# Patient Record
Sex: Male | Born: 1960 | Race: White | Hispanic: No | Marital: Married | Smoking: Never smoker
Health system: Southern US, Community
[De-identification: ages and names within clinical notes are randomized; demographics above are authoritative.]

## PROBLEM LIST (undated history)

## (undated) DIAGNOSIS — R7989 Other specified abnormal findings of blood chemistry: Secondary | ICD-10-CM

## (undated) DIAGNOSIS — F329 Major depressive disorder, single episode, unspecified: Secondary | ICD-10-CM

## (undated) DIAGNOSIS — F32A Depression, unspecified: Secondary | ICD-10-CM

## (undated) HISTORY — DX: Major depressive disorder, single episode, unspecified: F32.9

## (undated) HISTORY — DX: Depression, unspecified: F32.A

## (undated) HISTORY — DX: Other specified abnormal findings of blood chemistry: R79.89

---

## 2006-09-12 ENCOUNTER — Encounter: Admission: RE | Admit: 2006-09-12 | Discharge: 2006-09-12 | Payer: Self-pay | Admitting: Allergy and Immunology

## 2006-09-20 ENCOUNTER — Encounter: Admission: RE | Admit: 2006-09-20 | Discharge: 2006-09-20 | Payer: Self-pay | Admitting: Allergy and Immunology

## 2008-07-13 IMAGING — CT CT ABDOMEN W/ CM
2 of 5 series · 17 of 46 positions shown, 19 images · IV contrast (READICAT/WATER & [ID] OMNI 300)
Comparison: Ultrasound exam from 09/12/2006

ABDOMEN CT WITH CONTRAST:

CLINICAL DATA: Hydronephrosis
TECHNIQUE: Multidetector CT imaging of the abdomen and pelvis was performed
following the standard protocol during bolus administration of intravenous
contrast.

Contrast:  125 cc Omnipaque 300

[Series 3: routine abdomen · axial · 0.78mm/px · z∈[-431,-31]mm · 14 of 89 slices shown, 16 images]
[im 5/89  soft-tissue]
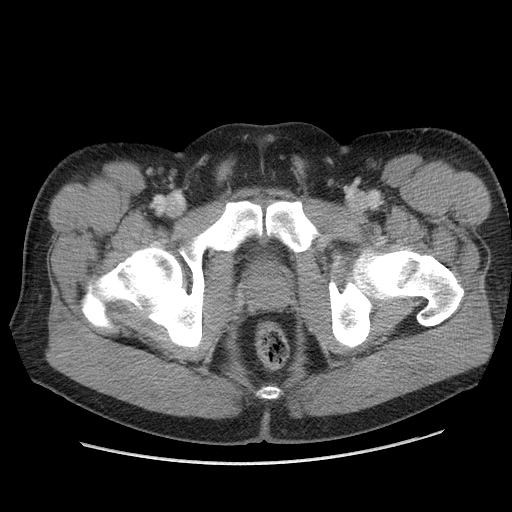
[im 5/89  bone]
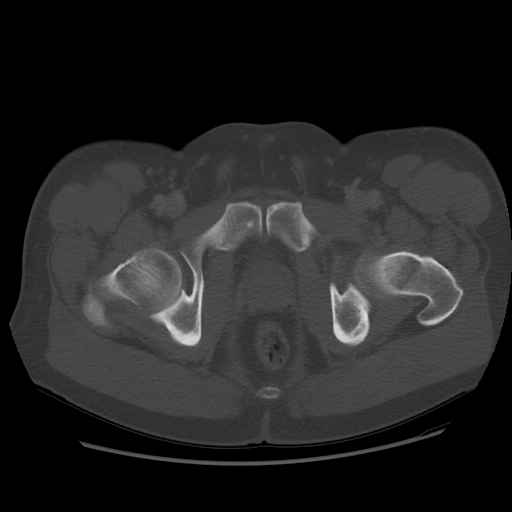
[im 14/89  soft-tissue]
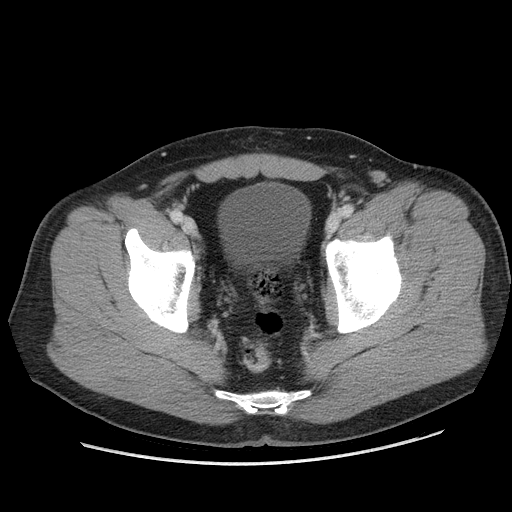
[im 18/89  soft-tissue]
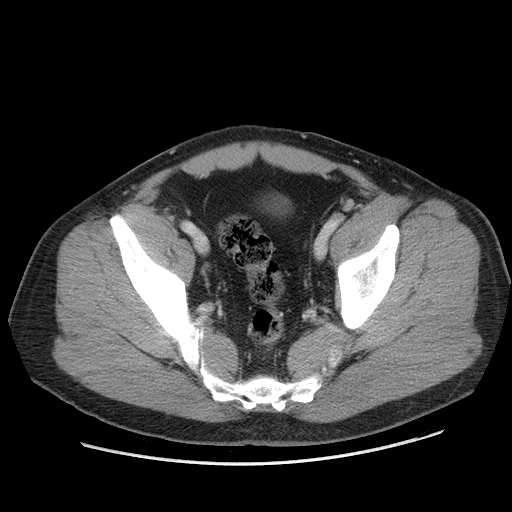
[im 23/89  soft-tissue]
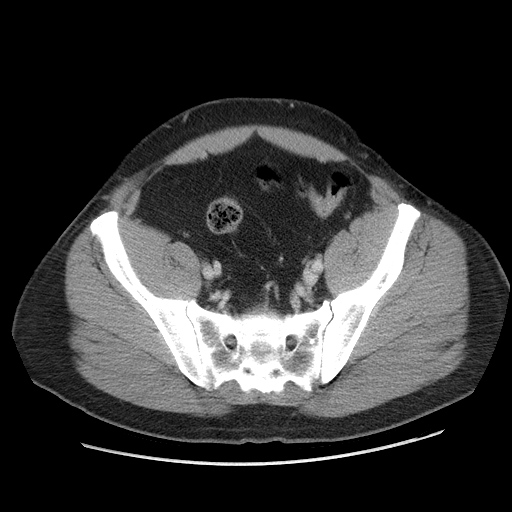
[im 31/89  soft-tissue]
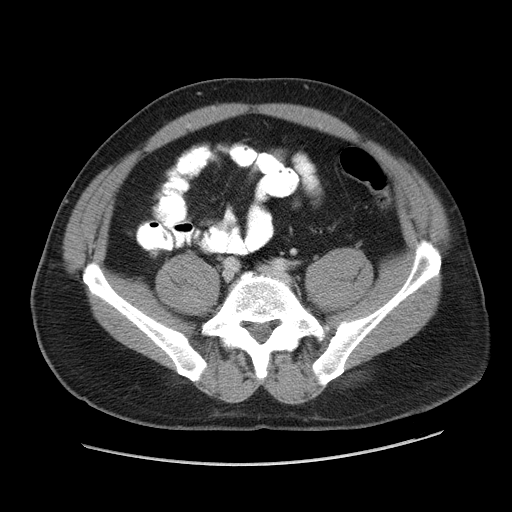
[im 36/89  soft-tissue]
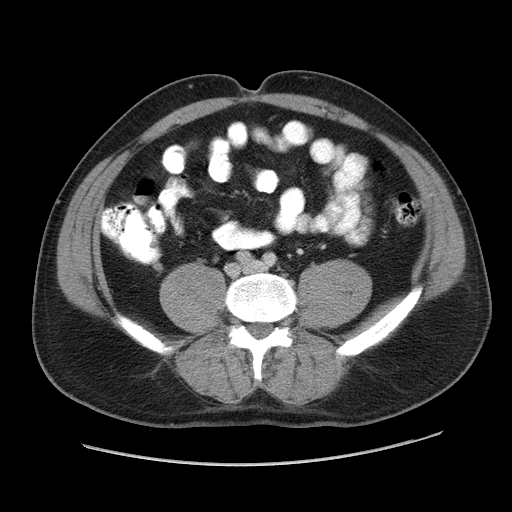
[im 40/89  soft-tissue]
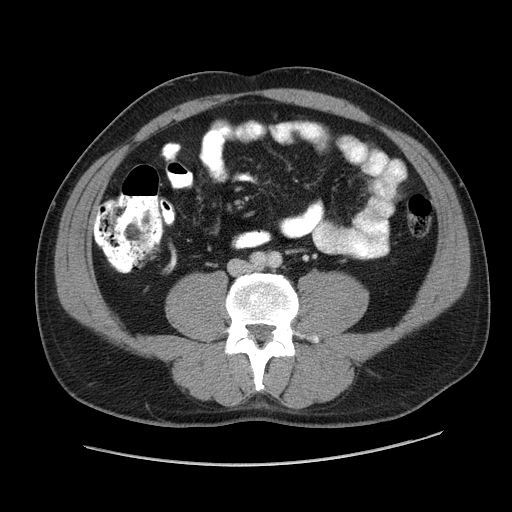
[im 49/89  soft-tissue]
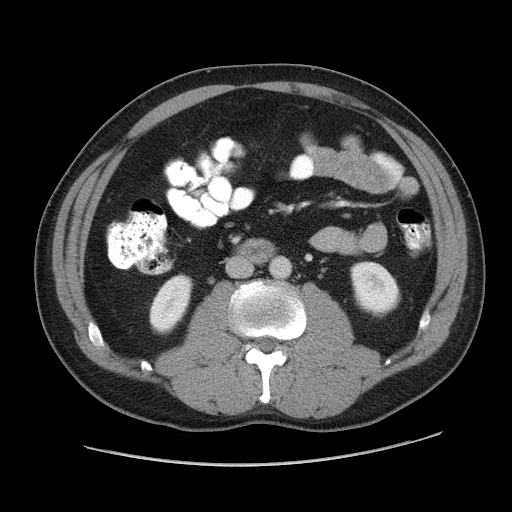
[im 53/89  soft-tissue]
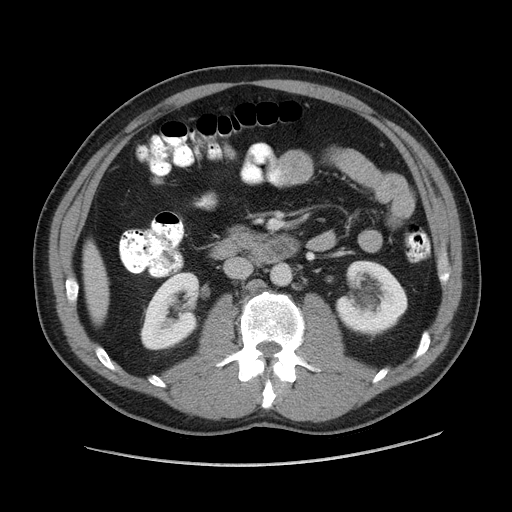
[im 53/89  bone]
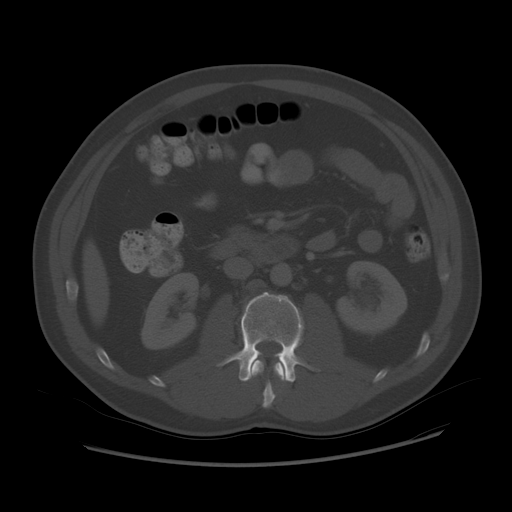
[im 58/89  soft-tissue]
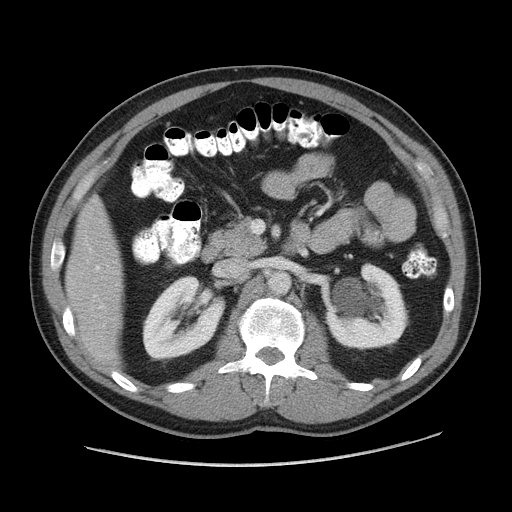
[im 67/89  soft-tissue]
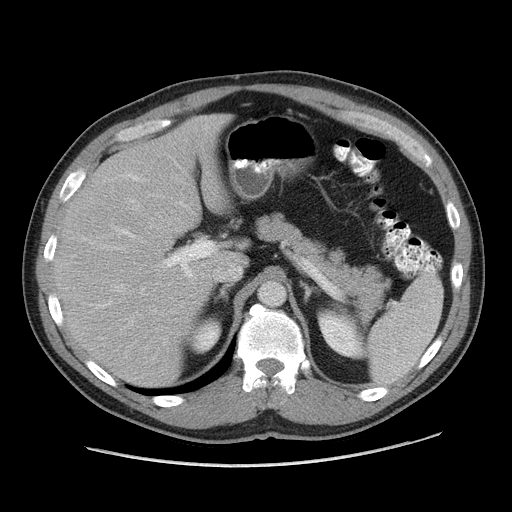
[im 71/89  soft-tissue]
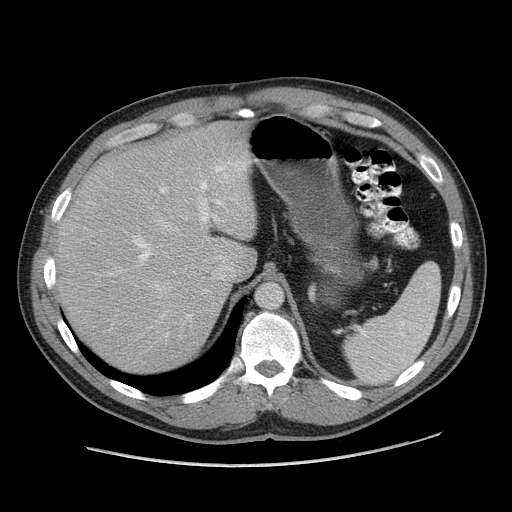
[im 75/89  soft-tissue]
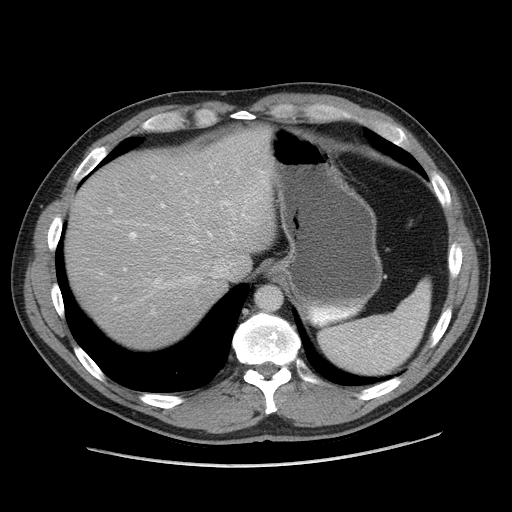
[im 84/89  soft-tissue]
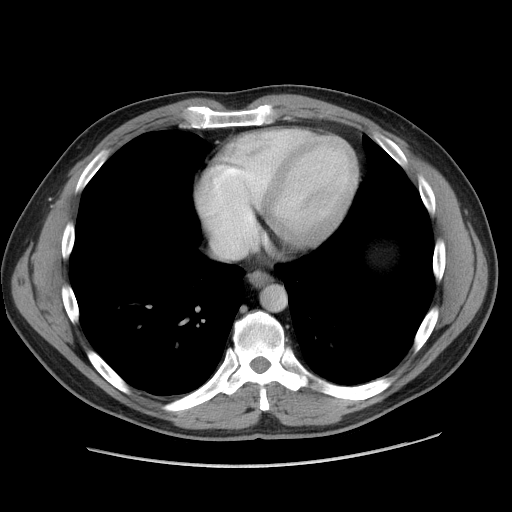

[Series 602: sagittal body · sagittal · 0.96mm/px · 3 of 161 slices shown]
[im 54/161  soft-tissue]
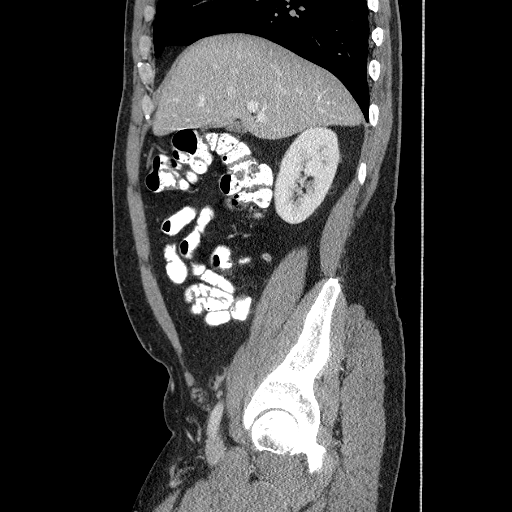
[im 72/161  soft-tissue]
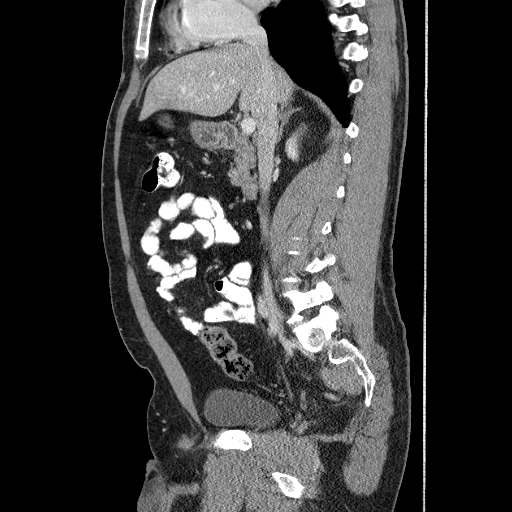
[im 89/161  soft-tissue]
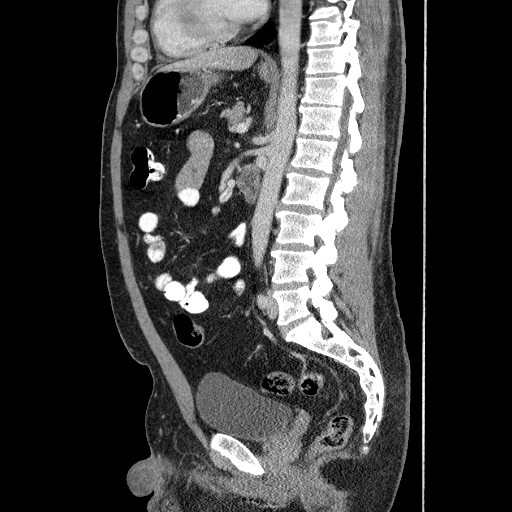

[17 of 46 positions shown; findings below may reference images not displayed]

FINDINGS: No focal abnormality is seen in the liver or spleen. Mild fatty
infiltration of the liver is noted. The stomach, duodenum, pancreas,
gallbladder, adrenal glands, and right kidney have normal imaging features. The
tiny cholesterol polyp seen in the gallbladder on the previous ultrasound is not
apparent by CT. A [DATE] x 3.3 cm central sinus cyst is identified in the left
kidney. This would account for the ultrasound appearance of left hydronephrosis.
IMPRESSION: Central sinus cyst in the left kidney accounts for the appearance of left
hydronephrosis on the recent ultrasound. Today's CT scan demonstrates that there
is no hydronephrosis in either kidney.

PELVIS CT WITH CONTRAST:
FINDINGS: No aortic aneurysm. No intraperitoneal free fluid. No pelvic
lymphadenopathy. Bladder is unremarkable. Colon has normal imaging features. The
terminal ileum and the appendix are normal.
IMPRESSION: Unremarkable CT examination of the pelvis.

## 2011-11-03 ENCOUNTER — Encounter: Payer: Self-pay | Admitting: Family Medicine

## 2011-11-03 ENCOUNTER — Ambulatory Visit (INDEPENDENT_AMBULATORY_CARE_PROVIDER_SITE_OTHER): Payer: BC Managed Care – PPO | Admitting: Family Medicine

## 2011-11-03 VITALS — BP 140/90 | HR 72 | Temp 97.8°F | Resp 12 | Ht 69.75 in | Wt 201.0 lb

## 2011-11-03 DIAGNOSIS — E291 Testicular hypofunction: Secondary | ICD-10-CM

## 2011-11-03 DIAGNOSIS — Z0000 Encounter for general adult medical examination without abnormal findings: Secondary | ICD-10-CM

## 2011-11-03 DIAGNOSIS — R209 Unspecified disturbances of skin sensation: Secondary | ICD-10-CM

## 2011-11-03 DIAGNOSIS — R6882 Decreased libido: Secondary | ICD-10-CM

## 2011-11-03 DIAGNOSIS — R7989 Other specified abnormal findings of blood chemistry: Secondary | ICD-10-CM | POA: Insufficient documentation

## 2011-11-03 DIAGNOSIS — R202 Paresthesia of skin: Secondary | ICD-10-CM

## 2011-11-03 LAB — CBC WITH DIFFERENTIAL/PLATELET
Basophils Absolute: 0.1 10*3/uL (ref 0.0–0.1)
Eosinophils Relative: 2.4 % (ref 0.0–5.0)
HCT: 45 % (ref 39.0–52.0)
Lymphs Abs: 1.7 10*3/uL (ref 0.7–4.0)
Monocytes Relative: 12.6 % — ABNORMAL HIGH (ref 3.0–12.0)
Neutrophils Relative %: 59.5 % (ref 43.0–77.0)
Platelets: 249 10*3/uL (ref 150.0–400.0)
RDW: 13.1 % (ref 11.5–14.6)
WBC: 7 10*3/uL (ref 4.5–10.5)

## 2011-11-03 LAB — LIPID PANEL
Cholesterol: 200 mg/dL (ref 0–200)
HDL: 39.8 mg/dL (ref >39.00)
Total CHOL/HDL Ratio: 5
Triglycerides: 252 mg/dL — ABNORMAL HIGH (ref 0.0–149.0)
VLDL: 50.4 mg/dL — ABNORMAL HIGH (ref 0.0–40.0)

## 2011-11-03 LAB — POCT URINALYSIS DIPSTICK
Bilirubin, UA: NEGATIVE
Glucose, UA: NEGATIVE
Ketones, UA: NEGATIVE
Leukocytes, UA: NEGATIVE
Nitrite, UA: NEGATIVE

## 2011-11-03 LAB — HEPATIC FUNCTION PANEL
AST: 18 U/L (ref 0–37)
Albumin: 4.3 g/dL (ref 3.5–5.2)
Alkaline Phosphatase: 79 U/L (ref 39–117)
Total Bilirubin: 0.6 mg/dL (ref 0.3–1.2)

## 2011-11-03 LAB — BASIC METABOLIC PANEL
CO2: 30 mEq/L (ref 19–32)
Chloride: 101 mEq/L (ref 96–112)
Creatinine, Ser: 1.1 mg/dL (ref 0.4–1.5)
Potassium: 4.8 mEq/L (ref 3.5–5.1)
Sodium: 138 mEq/L (ref 135–145)

## 2011-11-03 LAB — TSH: TSH: 1.74 u[IU]/mL (ref 0.35–5.50)

## 2011-11-03 LAB — TESTOSTERONE: Testosterone: 276.3 ng/dL — ABNORMAL LOW (ref 350.00–890.00)

## 2011-11-03 LAB — LDL CHOLESTEROL, DIRECT: Direct LDL: 116.2 mg/dL

## 2011-11-03 MED ORDER — TETANUS-DIPHTH-ACELL PERTUSSIS 5-2.5-18.5 LF-MCG/0.5 IM SUSP: 0.5000 mL | Freq: Once | INTRAMUSCULAR | Status: DC

## 2011-11-03 NOTE — Patient Instructions (Addendum)
Try to eliminate oral tobacco use

## 2011-11-03 NOTE — Progress Notes (Signed)
Subjective:    Patient ID: Allen Webster, male    DOB: 1960/04/07, 51 y.o.   MRN: [id number]  HPI  Here to establish and for complete physical. History of reported low testosterone. Was briefly replaced but does not recall any symptomatic improvement. He requests repeat levels today. He's had no prior surgeries. Takes no regular medications. Uses oral tobacco but no history of smoking. No alcohol use.  Had some recent problems with progressive paresthesias mostly feet to lesser extent hands. Symptoms are bilateral. Burning dysesthesias. Questionable history of low B12. Had seen alternative medicine physician who at one point replaced . He is non vegan. Does consume dairy products. No history of gastric surgery. No history of bowel problems. Also having increased fatigue. No consistent exercise. Increased job stress as Barista.  Last tetanus over 10 years ago. Family history significant father coronary disease at 33. Mother history of stroke and supranuclear palsy.  Past Medical History  Diagnosis Date  . Depression   . Low testosterone    History reviewed. No pertinent past surgical history.  reports that he has never smoked. His smokeless tobacco use includes Chew. His alcohol and drug histories not on file. family history includes Arthritis in his maternal grandfather, maternal grandmother, paternal grandfather, and paternal grandmother; Heart disease (age of onset:61) in his father; Hypertension in his father; and Stroke in his mother. No Known Allergies   Review of Systems  Constitutional: Positive for fatigue. Negative for fever, activity change and appetite change.  HENT: Negative for ear pain, congestion and trouble swallowing.   Eyes: Negative for pain and visual disturbance.  Respiratory: Negative for cough, shortness of breath and wheezing.   Cardiovascular: Negative for chest pain and palpitations.  Gastrointestinal: Negative for nausea, vomiting, abdominal pain,  diarrhea, constipation, blood in stool, abdominal distention and rectal pain.  Genitourinary: Negative for dysuria, hematuria and testicular pain.  Musculoskeletal: Negative for joint swelling and arthralgias.  Skin: Negative for rash.  Neurological: Negative for dizziness, syncope and headaches.  Hematological: Negative for adenopathy.  Psychiatric/Behavioral: Negative for confusion and dysphoric mood.       Objective:   Physical Exam  Constitutional: He is oriented to person, place, and time. He appears well-developed and well-nourished. No distress.  HENT:  Head: Normocephalic and atraumatic.  Right Ear: External ear normal.  Left Ear: External ear normal.  Mouth/Throat: Oropharynx is clear and moist.  Eyes: Conjunctivae and EOM are normal. Pupils are equal, round, and reactive to light.  Neck: Normal range of motion. Neck supple. No thyromegaly present.  Cardiovascular: Normal rate, regular rhythm and normal heart sounds.   No murmur heard. Pulmonary/Chest: No respiratory distress. He has no wheezes. He has no rales.  Abdominal: Soft. Bowel sounds are normal. He exhibits no distension and no mass. There is no tenderness. There is no rebound and no guarding.  Genitourinary: Rectum normal and prostate normal.  Musculoskeletal: He exhibits no edema.  Lymphadenopathy:    He has no cervical adenopathy.  Neurological: He is alert and oriented to person, place, and time. He displays normal reflexes. No cranial nerve deficit.  Skin: No rash noted.  Psychiatric: He has a normal mood and affect.          Assessment & Plan:  #1 complete physical. Obtain screening labs. Stop oral tobacco use. Establish more consistent exercise. Tetanus booster given. Consider colonoscopy and PSA screening by next year #2 low libido and fatigue. Reported history of low testosterone. Repeat testosterone level. Patient is  interested in replacement if low  #3 paresthesias involving mostly feet. Check labs  including glucose, B12 level, and thyroid function.

## 2011-11-09 ENCOUNTER — Telehealth: Payer: Self-pay | Admitting: Family Medicine

## 2011-11-09 NOTE — Telephone Encounter (Signed)
Patient's wife is calling stating that he never received a call back with lab results. Please assist.

## 2011-11-10 ENCOUNTER — Other Ambulatory Visit: Payer: Self-pay | Admitting: *Deleted

## 2011-11-10 DIAGNOSIS — E349 Endocrine disorder, unspecified: Secondary | ICD-10-CM

## 2011-11-10 MED ORDER — TESTOSTERONE 20.25 MG/ACT (1.62%) TD GEL: 2.0000 | Freq: Every morning | TRANSDERMAL | Status: DC

## 2011-11-10 NOTE — Telephone Encounter (Signed)
i don't recall seeing these in my in basket.  We need to try to see why they did not end up there.  Labs significant for elevated triglycerides and low testosterone. If patient interested in replacement, would consider Androgel 1.62% one pump spray each arm once daily and repeat total testosterone level in one month.

## 2011-11-10 NOTE — Telephone Encounter (Signed)
Wife informed, labs and Cholesterol diet tips mailed to pt home with instructions included.  Androgel and repeat Testosterone future ordered

## 2012-01-02 ENCOUNTER — Encounter: Payer: Self-pay | Admitting: Family Medicine

## 2012-01-02 ENCOUNTER — Ambulatory Visit (INDEPENDENT_AMBULATORY_CARE_PROVIDER_SITE_OTHER): Payer: BC Managed Care – PPO | Admitting: Family Medicine

## 2012-01-02 VITALS — BP 115/78 | Temp 98.0°F | Wt 200.0 lb

## 2012-01-02 DIAGNOSIS — R7989 Other specified abnormal findings of blood chemistry: Secondary | ICD-10-CM

## 2012-01-02 DIAGNOSIS — E291 Testicular hypofunction: Secondary | ICD-10-CM

## 2012-01-02 LAB — TESTOSTERONE: Testosterone: 278.58 ng/dL — ABNORMAL LOW (ref 350.00–890.00)

## 2012-01-02 NOTE — Progress Notes (Signed)
  Subjective:    Patient ID: Allen Webster, male    DOB: April 13, 1960, 51 y.o.   MRN: [id number]  HPI  Recent issues with fatigue and low libido. He thinks some of this is job stress related. Testosterone was low at 276. We initiated replacement with AndroGel pump. Currently one spray pump per arm once daily. Has not seen much change in symptoms yet. No followup labs yet. He has some fatigue and decreased stamina but no mood disorder.   Review of Systems  Constitutional: Positive for fatigue.  Eyes: Negative for visual disturbance.  Respiratory: Negative for cough, chest tightness and shortness of breath.   Cardiovascular: Negative for chest pain, palpitations and leg swelling.  Neurological: Negative for dizziness, syncope, weakness, light-headedness and headaches.       Objective:   Physical Exam  Constitutional: He appears well-developed and well-nourished.  Cardiovascular: Normal rate and regular rhythm.   No murmur heard. Pulmonary/Chest: Effort normal and breath sounds normal. No respiratory distress. He has no wheezes. He has no rales.  Musculoskeletal: He exhibits no edema.          Assessment & Plan:  Low testosterone. Recheck total testosterone level. If he is adequately replaced and still feeling no benefit symptomatically he will consider discontinuation. Flu vaccine offered and declined.

## 2012-01-03 ENCOUNTER — Other Ambulatory Visit: Payer: Self-pay | Admitting: *Deleted

## 2012-01-03 DIAGNOSIS — Z8639 Personal history of other endocrine, nutritional and metabolic disease: Secondary | ICD-10-CM

## 2012-01-03 MED ORDER — TESTOSTERONE 20.25 MG/ACT (1.62%) TD GEL: 3.0000 | Freq: Every morning | TRANSDERMAL | Status: AC

## 2012-01-03 NOTE — Progress Notes (Signed)
Quick Note:  Pt wife called back and info was given to her as requested by her husband. ______

## 2012-01-03 NOTE — Progress Notes (Signed)
Quick Note:  LMTCB ______ 

## 2012-07-16 ENCOUNTER — Ambulatory Visit (INDEPENDENT_AMBULATORY_CARE_PROVIDER_SITE_OTHER): Payer: No Typology Code available for payment source | Admitting: Family Medicine

## 2012-07-16 ENCOUNTER — Encounter: Payer: Self-pay | Admitting: Family Medicine

## 2012-07-16 VITALS — BP 140/90 | Temp 98.7°F

## 2012-07-16 DIAGNOSIS — L03119 Cellulitis of unspecified part of limb: Secondary | ICD-10-CM

## 2012-07-16 DIAGNOSIS — T23209A Burn of second degree of unspecified hand, unspecified site, initial encounter: Secondary | ICD-10-CM

## 2012-07-16 DIAGNOSIS — T23201A Burn of second degree of right hand, unspecified site, initial encounter: Secondary | ICD-10-CM

## 2012-07-16 MED ORDER — CEPHALEXIN 500 MG PO CAPS: 500.0000 mg | ORAL_CAPSULE | Freq: Four times a day (QID) | ORAL | Status: AC

## 2012-07-16 NOTE — Progress Notes (Signed)
  Subjective:    Patient ID: Allen Webster, male    DOB: October 06, 1960, 52 y.o.   MRN: [id number]  HPI Acute visit Burn right hand last Thursday while making candy apples Doing relatively well until yesterday when he noticed some redness, increased swelling, and increased pain. Denies any fever or chills. Patient had tetanus booster August 2013. Right hand edema has increased throughout the day, especially with dependency    Review of Systems  Constitutional: Negative for fever and chills.       Objective:   Physical Exam  Constitutional: He appears well-developed and well-nourished.  Cardiovascular: Normal rate and regular rhythm.   Skin:  Right hand reveals a 1.5 x 3 cm vesicle from second degree burn. Unroofed vesicle and clear serous fluid drained. Surrounding area 6 about 8 cm of erythema, warmth and slight tenderness          Assessment & Plan:  Second degree burn right hand with secondary cellulitis changes now. Tetanus Up to date. Keflex 500 mg 4 times a day for 10 days. Elevate hand frequently. Recommended followup in 2 days reassess but he declines because of busy schedule. He knows to followup immediately for any fever or worsening redness

## 2012-07-16 NOTE — Patient Instructions (Addendum)
Cellulitis Cellulitis is an infection of the skin and the tissue beneath it. The infected area is usually red and tender. Cellulitis occurs most often in the arms and lower legs.   CAUSES   Cellulitis is caused by bacteria that enter the skin through cracks or cuts in the skin. The most common types of bacteria that cause cellulitis are Staphylococcus and Streptococcus. SYMPTOMS    Redness and warmth.   Swelling.   Tenderness or pain.   Fever.  DIAGNOSIS  Your caregiver can usually determine what is wrong based on a physical exam. Blood tests may also be done. TREATMENT   Treatment usually involves taking an antibiotic medicine. HOME CARE INSTRUCTIONS    Take your antibiotics as directed. Finish them even if you start to feel better.   Keep the infected arm or leg elevated to reduce swelling.   Apply a warm cloth to the affected area up to 4 times per day to relieve pain.   Only take over-the-counter or prescription medicines for pain, discomfort, or fever as directed by your caregiver.   Keep all follow-up appointments as directed by your caregiver.  SEEK MEDICAL CARE IF:    You notice red streaks coming from the infected area.   Your red area gets larger or turns dark in color.   Your bone or joint underneath the infected area becomes painful after the skin has healed.   Your infection returns in the same area or another area.   You notice a swollen bump in the infected area.   You develop new symptoms.  SEEK IMMEDIATE MEDICAL CARE IF:    You have a fever.   You feel very sleepy.   You develop vomiting or diarrhea.   You have a general ill feeling (malaise) with muscle aches and pains.  MAKE SURE YOU:    Understand these instructions.   Will watch your condition.   Will get help right away if you are not doing well or get worse.  Document Released: 12/01/2004 Document Revised: 08/23/2011 Document Reviewed: 05/09/2011 [employer] Patient Information 2013  [employer]    

## 2014-05-19 ENCOUNTER — Ambulatory Visit: Payer: No Typology Code available for payment source | Attending: Internal Medicine

## 2018-08-31 ENCOUNTER — Encounter: Payer: Self-pay | Admitting: Family Medicine

## 2018-08-31 ENCOUNTER — Ambulatory Visit: Payer: Self-pay | Attending: Family Medicine | Admitting: Family Medicine

## 2018-08-31 ENCOUNTER — Other Ambulatory Visit: Payer: Self-pay

## 2018-08-31 VITALS — BP 152/89 | HR 74 | Ht 69.75 in | Wt 196.6 lb

## 2018-08-31 DIAGNOSIS — R7303 Prediabetes: Secondary | ICD-10-CM

## 2018-08-31 DIAGNOSIS — G609 Hereditary and idiopathic neuropathy, unspecified: Secondary | ICD-10-CM

## 2018-08-31 LAB — POCT GLYCOSYLATED HEMOGLOBIN (HGB A1C): Hemoglobin A1C: 5.8 % — AB (ref 4.0–5.6)

## 2018-08-31 MED ORDER — VITAMIN B-12 1000 MCG PO TABS: 1000.0000 ug | ORAL_TABLET | Freq: Every day | ORAL | 5 refills | Status: AC

## 2018-08-31 NOTE — Progress Notes (Signed)
New patient, Talk about his Neuropathy

## 2018-08-31 NOTE — Progress Notes (Signed)
New Patient Office Visit  Subjective:  Patient ID: Allen Webster, male    DOB: [birth year]  Age: 58 y.o. MRN: [id number]  CC: establish care; neuropathy  HPI Allen Webster presents to establish care as a new patient.  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  Patient also reports a chronic history of numbness in his lower legs and feet.  He has an abnormal sensation when walking as if something is under his feet.  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  He reports that when he received B12 injections in the past he actually did feel better and had less numbness.  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  He has noticed that his numbness/abnormal sensation in his legs and feet are worse by the end of the day.  He does not have any history of elevated blood sugar/diabetes.  He believes that he had a heavy metal panel done in the past which was negative.  He does not have any history of a pinched nerve/degenerative disc disease in his back that he is aware of but patient states that he is done a lot of labor-intensive work in the past and this could be a possibility although he does not currently have a lot of issues with back pain.  He feels that overall he is very healthy.  He does currently use chewing tobacco/tobacco pouch and is trying to find a natural mint pouch that he used in the past to help stop tobacco use.   Past Medical History:  Diagnosis Date  . Depression   . Low testosterone     Past Surgical History:  Procedure Laterality Date  . NO PAST SURGERIES      Family History  Problem  Relation Age of Onset  . Stroke Mother   . Hypertension Father   . Heart disease Father [id number]  . Arthritis Maternal Grandmother   . Arthritis Maternal Grandfather   . Arthritis Paternal Grandmother   . Arthritis Paternal Grandfather     Social History   Tobacco Use  . Smoking status: Never Smoker  . Smokeless tobacco: Current User    Types: Chew  Substance Use Topics  . Alcohol use: Not Currently    Frequency: Never  . Drug use: Never    ROS Review of Systems  Constitutional: Negative for chills, fatigue and unexpected weight change.  HENT: Negative for sore throat and trouble swallowing.   Respiratory: Negative for cough and shortness of breath.   Cardiovascular: Negative for chest pain and leg swelling.  Gastrointestinal: Negative for abdominal pain, constipation, diarrhea and nausea.  Endocrine: Positive for polydipsia (thinks this is related to working outside and he drinks a lot of water). Negative for polyphagia and polyuria.  Genitourinary: Negative for dysuria and frequency.  Musculoskeletal: Negative for arthralgias, back pain, gait problem, joint swelling and myalgias.  Neurological: Positive for numbness. Negative for dizziness and headaches.  Hematological: Negative for adenopathy. Does not bruise/bleed easily.    Objective:   Today's Vitals: BP (!) 152/89 (BP Location: Right  Arm, Patient Position: Sitting, Cuff Size: Large)   Pulse 74   Ht 5' 9.75" (1.772 m)   Wt 196 lb 9.6 oz (89.2 kg)   SpO2 97%   BMI 28.41 kg/m   Physical Exam  Assessment & Plan:  1. Idiopathic peripheral neuropathy Patient reports that he has had some longstanding issues with peripheral neuropathy/numbness and abnormal sensation in the feet and lower legs.  He states that he was told in the past that he had a vitamin B12 deficiency and he did have some improvement while taking vitamin B12.  Discussed with patient that I would also like to take hemoglobin A1c to look for possible  diabetes.  If his symptoms are not improving, he may also benefit from nerve testing/EMG.  Prescription provided for vitamin B12 to see if this gives any improvement in his current symptoms.  Patient has been encouraged to apply for the assistance program through this office to see if he may qualify for assistance with cost of medical visits/blood work and specialty visits. - HgB A1c - vitamin B-12 (CYANOCOBALAMIN) 1000 MCG tablet; Take 1 tablet (1,000 mcg total) by mouth daily.  Dispense: 30 tablet; Refill: 5  2. Prediabetes Patient with hemoglobin A1c of 5.8 which is slightly above normal and indicates an increased risk of diabetes.  Patient is encouraged to follow a low carbohydrate diet, continue exercise.  I am not sure if this level has been higher in the past which led to some of patient's neuropathic symptoms.  Test will be repeated in 3 to 4 months but he should return sooner if having any increased thirst, urinary frequency, blurred vision, increased appetite or wounds that are slow to heal which could be indicative of increased blood sugar levels/diabetes.  Outpatient Encounter Medications as of 08/31/2018  Medication Sig  . cephALEXin (KEFLEX) 500 MG capsule Take 1 capsule (500 mg total) by mouth 4 (four) times daily. (Patient not taking: Reported on 08/31/2018)  . Testosterone (ANDROGEL PUMP) 20.25 MG/ACT (1.62%) GEL Place 3 Act onto the skin every morning. Two pumps to one upper arm and one pump to the other upper arm/shoulder area, total 3 pumps daily (Patient not taking: Reported on 08/31/2018)  . vitamin B-12 (CYANOCOBALAMIN) 1000 MCG tablet Take 1 tablet (1,000 mcg total) by mouth daily.   No facility-administered encounter medications on file as of 08/31/2018.    An After Visit Summary was printed and given to the patient.  Follow-up: Return in about 6 weeks (around 10/12/2018) for nurse visit for ABI/BP recheck; .   Antony Blackbird, MD

## 2018-08-31 NOTE — Patient Instructions (Addendum)
Peripheral Neuropathy Peripheral neuropathy is a type of nerve damage. It affects nerves that carry signals between the spinal cord and the arms, legs, and the rest of the body (peripheral nerves). It does not affect nerves in the spinal cord or brain. In peripheral neuropathy, one nerve or a group of nerves may be damaged. Peripheral neuropathy is a broad category that includes many specific nerve disorders, like diabetic neuropathy, hereditary neuropathy, and carpal tunnel syndrome. What are the causes? This condition may be caused by:  Diabetes. This is the most common cause of peripheral neuropathy.  Nerve injury.  Pressure or stress on a nerve that lasts a long time.  Lack (deficiency) of B vitamins. This can result from alcoholism, poor diet, or a restricted diet.  Infections.  Autoimmune diseases, such as rheumatoid arthritis and systemic lupus erythematosus.  Nerve diseases that are passed from parent to child (inherited).  Some medicines, such as cancer medicines (chemotherapy).  Poisonous (toxic) substances, such as lead and mercury.  Too little blood flowing to the legs.  Kidney disease.  Thyroid disease. In some cases, the cause of this condition is not known. What are the signs or symptoms? Symptoms of this condition depend on which of your nerves is damaged. Common symptoms include:  Loss of feeling (numbness) in the feet, hands, or both.  Tingling in the feet, hands, or both.  Burning pain.  Very sensitive skin.  Weakness.  Not being able to move a part of the body (paralysis).  Muscle twitching.  Clumsiness or poor coordination.  Loss of balance.  Not being able to control your bladder.  Feeling dizzy.  Sexual problems. How is this diagnosed? Diagnosing and finding the cause of peripheral neuropathy can be difficult. Your health care provider will take your medical history and do a physical exam. A neurological exam will also be done. This  involves checking things that are affected by your brain, spinal cord, and nerves (nervous system). For example, your health care provider will check your reflexes, how you move, and what you can feel. You may have other tests, such as:  Blood tests.  Electromyogram (EMG) and nerve conduction tests. These tests check nerve function and how well the nerves are controlling the muscles.  Imaging tests, such as CT scans or MRI to rule out other causes of your symptoms.  Removing a small piece of nerve to be examined in a lab (nerve biopsy). This is rare.  Removing and examining a small amount of the fluid that surrounds the brain and spinal cord (lumbar puncture). This is rare. How is this treated? Treatment for this condition may involve:  Treating the underlying cause of the neuropathy, such as diabetes, kidney disease, or vitamin deficiencies.  Stopping medicines that can cause neuropathy, such as chemotherapy.  Medicine to relieve pain. Medicines may include: ? Prescription or over-the-counter pain medicine. ? Antiseizure medicine. ? Antidepressants. ? Pain-relieving patches that are applied to painful areas of skin.  Surgery to relieve pressure on a nerve or to destroy a nerve that is causing pain.  Physical therapy to help improve movement and balance.  Devices to help you move around (assistive devices). Follow these instructions at home: Medicines  Take over-the-counter and prescription medicines only as told by your health care provider. Do not take any other medicines without first asking your health care provider.  Do not drive or use heavy machinery while taking prescription pain medicine. Lifestyle   Do not use any products that contain nicotine   or tobacco, such as cigarettes and e-cigarettes. Smoking keeps blood from reaching damaged nerves. If you need help quitting, ask your health care provider.  Avoid or limit alcohol. Too much alcohol can cause a vitamin B  deficiency, and vitamin B is needed for healthy nerves.  Eat a healthy diet. This includes: ? Eating foods that are high in fiber, such as fresh fruits and vegetables, whole grains, and beans. ? Limiting foods that are high in fat and processed sugars, such as fried or sweet foods. General instructions   If you have diabetes, work closely with your health care provider to keep your blood sugar under control.  If you have numbness in your feet: ? Check every day for signs of injury or infection. Watch for redness, warmth, and swelling. ? Wear padded socks and comfortable shoes. These help protect your feet.  Develop a good support system. Living with peripheral neuropathy can be stressful. Consider talking with a mental health specialist or joining a support group.  Use assistive devices and attend physical therapy as told by your health care provider. This may include using a walker or a cane.  Keep all follow-up visits as told by your health care provider. This is important. Contact a health care provider if:  You have new signs or symptoms of peripheral neuropathy.  You are struggling emotionally from dealing with peripheral neuropathy.  Your pain is not well-controlled. Get help right away if:  You have an injury or infection that is not healing normally.  You develop new weakness in an arm or leg.  You fall frequently. Summary  Peripheral neuropathy is when the nerves in the arms, or legs are damaged, resulting in numbness, weakness, or pain.  There are many causes of peripheral neuropathy, including diabetes, pinched nerves, vitamin deficiencies, autoimmune disease, and hereditary conditions.  Diagnosing and finding the cause of peripheral neuropathy can be difficult. Your health care provider will take your medical history, do a physical exam, and do tests, including blood tests and nerve function tests.  Treatment involves treating the underlying cause of the  neuropathy and taking medicines to help control pain. Physical therapy and assistive devices may also help. This information is not intended to replace advice given to you by your health care provider. Make sure you discuss any questions you have with your health care provider. Document Released: 02/11/2002 Document Revised: 05/02/2016 Document Reviewed: 05/02/2016 Elsevier Interactive Patient Education  2019 [employer]  Preventing Type 2 Diabetes Mellitus Type 2 diabetes (type 2 diabetes mellitus) is a long-term (chronic) disease that affects blood sugar (glucose) levels. Normally, a hormone called insulin allows glucose to enter cells in the body. The cells use glucose for energy. In type 2 diabetes, one or both of these problems may be present:  The body does not make enough insulin.  The body does not respond properly to insulin that it makes (insulin resistance). Insulin resistance or lack of insulin causes excess glucose to build up in the blood instead of going into cells. As a result, high blood glucose (hyperglycemia) develops, which can cause many complications. Being overweight or obese and having an inactive (sedentary) lifestyle can increase your risk for diabetes. Type 2 diabetes can be delayed or prevented by making certain nutrition and lifestyle changes. What nutrition changes can be made?   Eat healthy meals and snacks regularly. Keep a healthy snack with you for when you get hungry between meals, such as fruit or a handful of nuts.  Eat  lean meats and proteins that are low in saturated fats, such as chicken, fish, egg whites, and beans. Avoid processed meats.  Eat plenty of fruits and vegetables and plenty of grains that have not been processed (whole grains). It is recommended that you eat: ? 1?2 cups of fruit every day. ? 2?3 cups of vegetables every day. ? 6?8 oz of whole grains every day, such as oats, whole wheat, bulgur, brown rice, quinoa, and millet.  Eat  low-fat dairy products, such as milk, yogurt, and cheese.  Eat foods that contain healthy fats, such as nuts, avocado, olive oil, and canola oil.  Drink water throughout the day. Avoid drinks that contain added sugar, such as soda or sweet tea.  Follow instructions from your health care provider about specific eating or drinking restrictions.  Control how much food you eat at a time (portion size). ? Check food labels to find out the serving sizes of foods. ? Use a kitchen scale to weigh amounts of foods.  Saute or steam food instead of frying it. Cook with water or broth instead of oils or butter.  Limit your intake of: ? Salt (sodium). Have no more than 1 tsp (2,400 mg) of sodium a day. If you have heart disease or high blood pressure, have less than ? tsp (1,500 mg) of sodium a day. ? Saturated fat. This is fat that is solid at room temperature, such as butter or fat on meat. What lifestyle changes can be made? Activity   Do moderate-intensity physical activity for at least 30 minutes on at least 5 days of the week, or as much as told by your health care provider.  Ask your health care provider what activities are safe for you. A mix of physical activities may be best, such as walking, swimming, cycling, and strength training.  Try to add physical activity into your day. For example: ? Park in spots that are farther away than usual, so that you walk more. For example, park in a far corner of the parking lot when you go to the office or the grocery store. ? Take a walk during your lunch break. ? Use stairs instead of elevators or escalators. Weight Loss  Lose weight as directed. Your health care provider can determine how much weight loss is best for you and can help you lose weight safely.  If you are overweight or obese, you may be instructed to lose at least 5?7 % of your body weight. Alcohol and Tobacco   Limit alcohol intake to no more than 1 drink a day for nonpregnant  women and 2 drinks a day for men. One drink equals 12 oz of beer, 5 oz of wine, or 1 oz of hard liquor.  Do not use any tobacco products, such as cigarettes, chewing tobacco, and e-cigarettes. If you need help quitting, ask your health care provider. Work With Your Health Care Provider  Have your blood glucose tested regularly, as told by your health care provider.  Discuss your risk factors and how you can reduce your risk for diabetes.  Get screening tests as told by your health care provider. You may have screening tests regularly, especially if you have certain risk factors for type 2 diabetes.  Make an appointment with a diet and nutrition specialist (registered dietitian). A registered dietitian can help you make a healthy eating plan and can help you understand portion sizes and food labels. Why are these changes important?  It is possible to prevent  or delay type 2 diabetes and related health problems by making lifestyle and nutrition changes.  It can be difficult to recognize signs of type 2 diabetes. The best way to avoid possible damage to your body is to take actions to prevent the disease before you develop symptoms. What can happen if changes are not made?  Your blood glucose levels may keep increasing. Having high blood glucose for a long time is dangerous. Too much glucose in your blood can damage your blood vessels, heart, kidneys, nerves, and eyes.  You may develop prediabetes or type 2 diabetes. Type 2 diabetes can lead to many chronic health problems and complications, such as: ? Heart disease. ? Stroke. ? Blindness. ? Kidney disease. ? Depression. ? Poor circulation in the feet and legs, which could lead to surgical removal (amputation) in severe cases. Where to find support  Ask your health care provider to recommend a registered dietitian, diabetes educator, or weight loss program.  Look for local or online weight loss groups.  Join a gym, fitness club, or  outdoor activity group, such as a walking club. Where to find more information To learn more about diabetes and diabetes prevention, visit:  American Diabetes Association (ADA): www.diabetes.[employer] of Diabetes and Digestive and Kidney Diseases: FindSpin.nl To learn more about healthy eating, visit:  The U.S. Department of Agriculture Scientist, research (physical sciences)), Choose My Plate: http://wiley-williams.com/  Office of Disease Prevention and Health Promotion (ODPHP), Dietary Guidelines: SurferLive.at Summary  You can reduce your risk for type 2 diabetes by increasing your physical activity, eating healthy foods, and losing weight as directed.  Talk with your health care provider about your risk for type 2 diabetes. Ask about any blood tests or screening tests that you need to have. This information is not intended to replace advice given to you by your health care provider. Make sure you discuss any questions you have with your health care provider. Document Released: 06/15/2015 Document Revised: 02/02/2017 Document Reviewed: 04/14/2015 Elsevier Interactive Patient Education  2019 [employer].

## 2018-09-08 ENCOUNTER — Encounter: Payer: Self-pay | Admitting: Family Medicine

## 2018-09-14 ENCOUNTER — Ambulatory Visit: Payer: Self-pay | Attending: Family Medicine | Admitting: Emergency Medicine

## 2018-09-14 ENCOUNTER — Other Ambulatory Visit: Payer: Self-pay

## 2018-09-14 VITALS — BP 148/94 | HR 82 | Temp 97.5°F | Resp 16 | Ht 69.0 in | Wt 196.2 lb

## 2018-09-14 DIAGNOSIS — I10 Essential (primary) hypertension: Secondary | ICD-10-CM
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
